# Patient Record
Sex: Female | Born: 1955 | Hispanic: No | Marital: Single | Smoking: Never smoker
Health system: Southern US, Community
[De-identification: ages and names within clinical notes are randomized; demographics above are authoritative.]

## PROBLEM LIST (undated history)

## (undated) DIAGNOSIS — E119 Type 2 diabetes mellitus without complications: Secondary | ICD-10-CM

## (undated) HISTORY — PX: APPENDECTOMY: SHX54

## (undated) HISTORY — DX: Type 2 diabetes mellitus without complications: E11.9

---

## 1997-11-17 ENCOUNTER — Other Ambulatory Visit: Admission: RE | Admit: 1997-11-17 | Discharge: 1997-11-17 | Payer: Self-pay | Admitting: Obstetrics and Gynecology

## 2003-09-09 ENCOUNTER — Emergency Department (HOSPITAL_COMMUNITY): Admission: EM | Admit: 2003-09-09 | Discharge: 2003-09-09 | Payer: Self-pay | Admitting: Emergency Medicine

## 2007-05-13 ENCOUNTER — Ambulatory Visit: Payer: Self-pay | Admitting: Obstetrics & Gynecology

## 2007-05-20 ENCOUNTER — Ambulatory Visit: Payer: Self-pay | Admitting: Obstetrics & Gynecology

## 2007-05-27 ENCOUNTER — Ambulatory Visit: Payer: Self-pay | Admitting: Urology

## 2007-06-17 ENCOUNTER — Ambulatory Visit: Payer: Self-pay | Admitting: Gastroenterology

## 2007-09-06 ENCOUNTER — Inpatient Hospital Stay: Payer: Self-pay | Admitting: Surgery

## 2009-01-14 ENCOUNTER — Emergency Department (HOSPITAL_COMMUNITY): Admission: EM | Admit: 2009-01-14 | Discharge: 2009-01-14 | Payer: Self-pay | Admitting: Emergency Medicine

## 2010-12-21 ENCOUNTER — Ambulatory Visit: Payer: Self-pay | Admitting: Obstetrics & Gynecology

## 2017-04-13 ENCOUNTER — Ambulatory Visit: Payer: Self-pay | Admitting: Obstetrics & Gynecology

## 2017-05-02 ENCOUNTER — Encounter: Payer: Self-pay | Admitting: Obstetrics & Gynecology

## 2017-05-02 ENCOUNTER — Ambulatory Visit (INDEPENDENT_AMBULATORY_CARE_PROVIDER_SITE_OTHER): Payer: POS | Admitting: Obstetrics & Gynecology

## 2017-05-02 VITALS — BP 120/80 | HR 82 | Ht 62.0 in | Wt 150.0 lb

## 2017-05-02 DIAGNOSIS — Z01419 Encounter for gynecological examination (general) (routine) without abnormal findings: Secondary | ICD-10-CM | POA: Diagnosis not present

## 2017-05-02 DIAGNOSIS — Z124 Encounter for screening for malignant neoplasm of cervix: Secondary | ICD-10-CM

## 2017-05-02 DIAGNOSIS — Z1231 Encounter for screening mammogram for malignant neoplasm of breast: Secondary | ICD-10-CM | POA: Diagnosis not present

## 2017-05-02 DIAGNOSIS — Z1211 Encounter for screening for malignant neoplasm of colon: Secondary | ICD-10-CM | POA: Diagnosis not present

## 2017-05-02 DIAGNOSIS — Z1239 Encounter for other screening for malignant neoplasm of breast: Secondary | ICD-10-CM

## 2017-05-02 DIAGNOSIS — Z0000 Encounter for general adult medical examination without abnormal findings: Secondary | ICD-10-CM | POA: Insufficient documentation

## 2017-05-02 NOTE — Patient Instructions (Signed)
PAP every three years Mammogram every year    Call [phone] to schedule at [facility] Colonoscopy every 10 years Labs yearly (with PCP) 

## 2017-05-02 NOTE — Progress Notes (Signed)
HPI:      [name] is a 61 y.o. G1P0010 who LMP was in the past, she presents today for her annual examination.  The patient has no complaints today. The patient is not sexually active. Herlast pap: approximate date 2014 and was normal and last mammogram: approximate date 2016 and was normal.  The patient does perform self breast exams.  There is notable family history of breast or ovarian cancer in her family. The patient is not taking hormone replacement therapy. Patient denies post-menopausal vaginal bleeding.   The patient has regular exercise: yes. The patient denies current symptoms of depression.    GYN Hx: Last Colonoscopy:10 years ago. Normal.  Last DEXA: DEXA ago.    PMHx: History reviewed. No pertinent past medical history. Past Surgical History:  Procedure Laterality Date  . APPENDECTOMY     Family History  Problem Relation Age of Onset  . Diabetes Mother   . Hypertension Mother   . Breast cancer Sister   . Diabetes Sister   . Heart disease Sister    Social History  Substance Use Topics  . Smoking status: Never Smoker  . Smokeless tobacco: Never Used  . Alcohol use Yes   No current outpatient prescriptions on file. Allergies: Patient has no known allergies.  Review of Systems  Constitutional: Negative for chills, fever and malaise/fatigue.  HENT: Negative for congestion, sinus pain and sore throat.   Eyes: Negative for blurred vision and pain.  Respiratory: Negative for cough and wheezing.   Cardiovascular: Negative for chest pain and leg swelling.  Gastrointestinal: Negative for abdominal pain, constipation, diarrhea, heartburn, nausea and vomiting.  Genitourinary: Negative for dysuria, frequency, hematuria and urgency.  Musculoskeletal: Negative for back pain, joint pain, myalgias and neck pain.  Skin: Negative for itching and rash.  Neurological: Negative for dizziness, tremors and weakness.  Endo/Heme/Allergies: Does not bruise/bleed easily.    Psychiatric/Behavioral: Negative for depression. The patient is not nervous/anxious and does not have insomnia.     Objective: BP 120/80   Pulse 82   Ht 5\' 2"  (1.575 m)   Wt 150 lb (68 kg)   BMI 27.44 kg/m   Filed Weights   05/02/17 1028  Weight: 150 lb (68 kg)   Body mass index is 27.44 kg/m. Physical Exam  Constitutional: She is oriented to person, place, and time. She appears well-developed and well-nourished. No distress.  Genitourinary: Rectum normal and vagina normal. Pelvic exam was performed with patient supine. There is no rash or lesion on the right labia. There is no rash or lesion on the left labia. Vagina exhibits no lesion. No bleeding in the vagina. Right adnexum does not display mass and does not display tenderness. Left adnexum does not display mass and does not display tenderness. Cervix does not exhibit motion tenderness, lesion, friability or polyp.   Uterus is enlarged, mobile and anteverted. Uterus is not exhibiting a mass.  Genitourinary Comments: Uterus sl enlarged, no pain  HENT:  Head: Normocephalic and atraumatic. Head is without laceration.  Right Ear: Hearing normal.  Left Ear: Hearing normal.  Nose: No epistaxis.  No foreign bodies.  Mouth/Throat: Uvula is midline, oropharynx is clear and moist and mucous membranes are normal.  Eyes: Pupils are equal, round, and reactive to light.  Neck: Normal range of motion. Neck supple. No thyromegaly present.  Cardiovascular: Normal rate and regular rhythm.  Exam reveals no gallop and no friction rub.   No murmur heard. Pulmonary/Chest: Effort normal and breath  sounds normal. No respiratory distress. She has no wheezes. Right breast exhibits no mass, no skin change and no tenderness. Left breast exhibits no mass, no skin change and no tenderness.  Abdominal: Soft. Bowel sounds are normal. She exhibits no distension. There is no tenderness. There is no rebound.  Musculoskeletal: Normal range of motion.   Neurological: She is alert and oriented to person, place, and time. No cranial nerve deficit.  Skin: Skin is warm and dry.  Psychiatric: She has a normal mood and affect. Judgment normal.  Vitals reviewed.   Assessment: Annual Exam 1. Annual physical exam   2. Screening for cervical cancer   3. Screening for breast cancer   4. Screen for colon cancer     Plan:            1.  Cervical Screening-  Pap smear done today  2. Breast screening- Exam annually and mammogram scheduled  3. Colonoscopy every 10 years, Hemoccult testing after age 67  4. Labs managed by PCP  5. Counseling for hormonal therapy: none, no change in therapy today     F/U  Return in about 1 year (around 05/02/2018) for Annual.  Annamarie Major, MD, [facility], [facility] 05/02/2017  10:52 AM

## 2017-05-05 LAB — IGP, APTIMA HPV
HPV Aptima: NEGATIVE
PAP Smear Comment: 0

## 2017-05-16 ENCOUNTER — Ambulatory Visit
Admission: RE | Admit: 2017-05-16 | Discharge: 2017-05-16 | Disposition: A | Discharge status: Home / Self Care | Payer: POS | Source: Ambulatory Visit | Attending: Obstetrics & Gynecology | Admitting: Obstetrics & Gynecology

## 2017-05-16 DIAGNOSIS — Z1231 Encounter for screening mammogram for malignant neoplasm of breast: Secondary | ICD-10-CM | POA: Diagnosis present

## 2017-05-16 DIAGNOSIS — Z1239 Encounter for other screening for malignant neoplasm of breast: Secondary | ICD-10-CM

## 2017-05-17 ENCOUNTER — Encounter: Payer: Self-pay | Admitting: Obstetrics & Gynecology

## 2018-06-26 ENCOUNTER — Other Ambulatory Visit: Payer: Self-pay | Admitting: Internal Medicine

## 2018-06-26 DIAGNOSIS — Z1231 Encounter for screening mammogram for malignant neoplasm of breast: Secondary | ICD-10-CM

## 2018-07-16 ENCOUNTER — Ambulatory Visit
Admission: RE | Admit: 2018-07-16 | Discharge: 2018-07-16 | Disposition: A | Discharge status: Home / Self Care | Payer: POS | Source: Ambulatory Visit | Attending: Internal Medicine | Admitting: Internal Medicine

## 2018-07-16 DIAGNOSIS — Z1231 Encounter for screening mammogram for malignant neoplasm of breast: Secondary | ICD-10-CM | POA: Insufficient documentation

## 2018-07-18 ENCOUNTER — Ambulatory Visit (INDEPENDENT_AMBULATORY_CARE_PROVIDER_SITE_OTHER): Payer: POS | Admitting: Obstetrics & Gynecology

## 2018-07-18 ENCOUNTER — Encounter: Payer: Self-pay | Admitting: Obstetrics & Gynecology

## 2018-07-18 VITALS — BP 130/82 | Ht 62.0 in | Wt 165.0 lb

## 2018-07-18 DIAGNOSIS — Z01419 Encounter for gynecological examination (general) (routine) without abnormal findings: Secondary | ICD-10-CM | POA: Diagnosis not present

## 2018-07-18 DIAGNOSIS — Z0000 Encounter for general adult medical examination without abnormal findings: Secondary | ICD-10-CM

## 2018-07-18 NOTE — Patient Instructions (Signed)
PAP every three years Mammogram every year    Done! Colonoscopy every 10 years, Done 2019! Labs yearly (with PCP)  Ann JunglingBlack Cohash for hot flashes   Over the counter  Black Cohosh, Cimicifuga racemosa oral dosage forms What is this medicine? BLACK COHOSH (blak KOH hosh) or Cimicifuga racemosa is a dietary supplement. It is promoted to relieve symptoms of menopause, such as hot flashes. The FDA has not approved this supplement for any medical use. This supplement may be used for other purposes; ask your health care provider or pharmacist if you have questions. This medicine may be used for other purposes; ask your health care provider or pharmacist if you have questions. What should I tell my health care provider before I take this medicine? They need to know if you have any of these conditions: -breast cancer -cervical, ovarian or uterine cancer -high blood pressure -infertility -liver disease -menstrual changes or irregular periods -unusual vaginal or uterine bleeding -an unusual or allergic reaction to black cohosh, soybeans, tartrazine dye (yellow dye number 5), other medicines, foods, dyes, or preservatives -pregnant or trying to get pregnant -breast-feeding How should I use this medicine? Take this herb by mouth with a glass of water. Follow the directions on the package labeling, or talk to your health care professional. Do not use for longer than 6 months without the advice of a health care professional. Do not use if you are pregnant or breast-feeding. Talk to your obstetrician-gynecologist or certified nurse-midwife. This herb is not for use in children under the age of 18 years. Overdosage: If you think you have taken too much of this medicine contact a poison control center or emergency room at once. NOTE: This medicine is only for you. Do not share this medicine with others. What if I miss a dose? If you miss a dose, take it as soon as you can. If it is almost time for your  next dose, take only that dose. Do not take double or extra doses. What may interact with this medicine? -atorvastatin -cisplatin -fertility treatments This list may not describe all possible interactions. Give your health care provider a list of all the medicines, herbs, non-prescription drugs, or dietary supplements you use. Also tell them if you smoke, drink alcohol, or use illegal drugs. Some items may interact with your medicine. What should I watch for while using this medicine? Since this herb is derived from a plant, allergic reactions are possible. Stop using this herb if you develop a rash. You may need to see your health care professional, or inform them that this occurred. Report any unusual side effects promptly. If you are taking this herb for menstrual or menopausal symptoms, visit your doctor or health care professional for regular checks on your progress. You should have a complete check-up every 6 months. You will need a regular breast and pelvic exam while on this therapy. Follow the advice of your doctor or health care professional. Women should inform their doctor if they wish to become pregnant or think they might be pregnant. If you have any reason to think you are pregnant, stop taking this herb at once and contact your doctor or health care professional. Herbal or dietary supplements are not regulated like medicines. Rigid quality control standards are not required for dietary supplements. The purity and strength of these products can vary. The safety and effect of this dietary supplement for a certain disease or illness is not well known. This product is not intended to diagnose,  treat, cure or prevent any disease. The Food and Drug Administration suggests the following to help consumers protect themselves: -Always read product labels and follow directions. -Natural does not mean a product is safe for humans to take. -Look for products that include USP after the ingredient  name. This means that the manufacturer followed the standards of the U.S. Pharmacopoeia. -Supplements made or sold by a nationally known food or drug company are more likely to be made under tight controls. You can write to the company for more information about how the product was made. What side effects may I notice from receiving this medicine? Side effects that you should report to your doctor or health care professional as soon as possible: -allergic reactions like skin rash, itching or hives, swelling of the face, lips, or tongue -breathing problems -dizziness -palpitations -signs and symptoms of liver injury like dark yellow or brown urine; general ill feeling or flu-like symptoms; light-colored stools; loss of appetite; nausea; right upper belly pain; unusually weak or tired; yellowing of the eyes or skin -unusual vaginal bleeding Side effects that usually do not require medical attention (report to your doctor or health care professional if they continue or are bothersome): -breast tenderness -headache -nausea -upset stomach This list may not describe all possible side effects. Call your doctor for medical advice about side effects. You may report side effects to FDA at 1-800-FDA-1088. Where should I keep my medicine? Keep out of the reach of children. Store at room temperature between 15 and 30 degrees C (59 and 86 degrees C). Throw away any unused herb after the expiration date. NOTE: This sheet is a summary. It may not cover all possible information. If you have questions about this medicine, talk to your doctor, pharmacist, or health care provider.  2018 Elsevier/Gold Standard (2016-02-23 14:35:09)

## 2018-07-18 NOTE — Progress Notes (Signed)
HPI:      [name] is a 62 y.o. G1P0010 who LMP was in the past, she presents today for her annual examination.  The patient has no complaints today. The patient is not currently sexually active. Herlast pap: approximate date 2018 and was normal and last mammogram: approximate date 06/2018 and was normal.  The patient does perform self breast exams.  There is no notable family history of breast or ovarian cancer in her family. The patient is not taking hormone replacement therapy. Patient denies post-menopausal vaginal bleeding.   The patient has regular exercise: yes. The patient denies current symptoms of depression. Occas hot flashes.   GYN Hx: Last Colonoscopy:5 mos ago. Normal.  Last DEXA: never ago.    PMHx: History reviewed. No pertinent past medical history. Past Surgical History:  Procedure Laterality Date  . APPENDECTOMY     Family History  Problem Relation Age of Onset  . Diabetes Mother   . Hypertension Mother   . Breast cancer Sister   . Diabetes Sister   . Heart disease Sister    Social History   Tobacco Use  . Smoking status: Never Smoker  . Smokeless tobacco: Never Used  Substance Use Topics  . Alcohol use: Yes  . Drug use: No   No current outpatient medications on file. Allergies: Patient has no known allergies.  Review of Systems  Constitutional: Negative for chills, fever and malaise/fatigue.  HENT: Negative for congestion, sinus pain and sore throat.   Eyes: Negative for blurred vision and pain.  Respiratory: Negative for cough and wheezing.   Cardiovascular: Negative for chest pain and leg swelling.  Gastrointestinal: Negative for abdominal pain, constipation, diarrhea, heartburn, nausea and vomiting.  Genitourinary: Negative for dysuria, frequency, hematuria and urgency.  Musculoskeletal: Negative for back pain, joint pain, myalgias and neck pain.  Skin: Negative for itching and rash.  Neurological: Negative for dizziness, tremors and  weakness.  Endo/Heme/Allergies: Does not bruise/bleed easily.  Psychiatric/Behavioral: Negative for depression. The patient is not nervous/anxious and does not have insomnia.     Objective: BP 130/82   Ht 5\' 2"  (1.575 m)   Wt 165 lb (74.8 kg)   BMI 30.18 kg/m   Filed Weights   07/18/18 1335  Weight: 165 lb (74.8 kg)   Body mass index is 30.18 kg/m. Physical Exam  Constitutional: She is oriented to person, place, and time. She appears well-developed and well-nourished. No distress.  Genitourinary: Rectum normal, vagina normal and uterus normal. Pelvic exam was performed with patient supine. There is no rash or lesion on the right labia. There is no rash or lesion on the left labia. Vagina exhibits no lesion. No bleeding in the vagina. Right adnexum does not display mass and does not display tenderness. Left adnexum does not display mass and does not display tenderness. Cervix does not exhibit motion tenderness, lesion, friability or polyp.   Uterus is mobile and midaxial. Uterus is not enlarged or exhibiting a mass.  HENT:  Head: Normocephalic and atraumatic. Head is without laceration.  Right Ear: Hearing normal.  Left Ear: Hearing normal.  Nose: No epistaxis.  No foreign bodies.  Mouth/Throat: Uvula is midline, oropharynx is clear and moist and mucous membranes are normal.  Eyes: Pupils are equal, round, and reactive to light.  Neck: Normal range of motion. Neck supple. No thyromegaly present.  Cardiovascular: Normal rate and regular rhythm. Exam reveals no gallop and no friction rub.  No murmur heard. Pulmonary/Chest: Effort normal and  breath sounds normal. No respiratory distress. She has no wheezes. Right breast exhibits no mass, no skin change and no tenderness. Left breast exhibits no mass, no skin change and no tenderness.  Abdominal: Soft. Bowel sounds are normal. She exhibits no distension. There is no tenderness. There is no rebound.  Musculoskeletal: Normal range of  motion.  Neurological: She is alert and oriented to person, place, and time. No cranial nerve deficit.  Skin: Skin is warm and dry.  Psychiatric: She has a normal mood and affect. Judgment normal.  Vitals reviewed.  Assessment: Annual Exam 1. Annual physical exam    Plan:            1.  Cervical Screening-  Pap smear schedule reviewed with patient  2. Breast screening- Exam annually and mammogram scheduled  3. Colonoscopy every 10 years, Hemoccult testing after age 60  4. Labs managed by PCP  5. Counseling for hormonal therapy: none. Black cohash counseled for hot flash remedy  6. DEXA next year    F/U  Return in about 1 year (around 07/19/2019) for Annual.  Annamarie Major, MD, [facility], [facility] 07/18/2018  2:38 PM

## 2020-03-08 ENCOUNTER — Other Ambulatory Visit: Payer: Self-pay | Admitting: Internal Medicine

## 2020-03-08 DIAGNOSIS — Z1231 Encounter for screening mammogram for malignant neoplasm of breast: Secondary | ICD-10-CM

## 2020-03-17 ENCOUNTER — Ambulatory Visit
Admission: RE | Admit: 2020-03-17 | Discharge: 2020-03-17 | Disposition: A | Discharge status: Home / Self Care | Payer: Medicare Other | Source: Ambulatory Visit | Attending: Internal Medicine | Admitting: Internal Medicine

## 2020-03-17 DIAGNOSIS — Z1231 Encounter for screening mammogram for malignant neoplasm of breast: Secondary | ICD-10-CM

## 2020-04-23 ENCOUNTER — Ambulatory Visit (INDEPENDENT_AMBULATORY_CARE_PROVIDER_SITE_OTHER): Payer: Medicare Other | Admitting: Obstetrics & Gynecology

## 2020-04-23 ENCOUNTER — Encounter: Payer: Self-pay | Admitting: Obstetrics & Gynecology

## 2020-04-23 ENCOUNTER — Other Ambulatory Visit: Payer: Self-pay

## 2020-04-23 ENCOUNTER — Other Ambulatory Visit (HOSPITAL_COMMUNITY)
Admission: RE | Admit: 2020-04-23 | Discharge: 2020-04-23 | Disposition: A | Discharge status: Home / Self Care | Payer: Medicare Other | Source: Ambulatory Visit | Attending: Obstetrics & Gynecology | Admitting: Obstetrics & Gynecology

## 2020-04-23 VITALS — BP 130/80 | Ht 62.0 in | Wt 161.0 lb

## 2020-04-23 DIAGNOSIS — Z1211 Encounter for screening for malignant neoplasm of colon: Secondary | ICD-10-CM | POA: Diagnosis not present

## 2020-04-23 DIAGNOSIS — Z01419 Encounter for gynecological examination (general) (routine) without abnormal findings: Secondary | ICD-10-CM | POA: Insufficient documentation

## 2020-04-23 DIAGNOSIS — Z1151 Encounter for screening for human papillomavirus (HPV): Secondary | ICD-10-CM | POA: Diagnosis not present

## 2020-04-23 DIAGNOSIS — Z780 Asymptomatic menopausal state: Secondary | ICD-10-CM | POA: Diagnosis not present

## 2020-04-23 DIAGNOSIS — Z124 Encounter for screening for malignant neoplasm of cervix: Secondary | ICD-10-CM

## 2020-04-23 NOTE — Patient Instructions (Signed)
PAP every three years Mammogram every year    Call [phone] to schedule at [facility] Colonoscopy every 10 years Labs yearly (with PCP)  Thank you for choosing [facility]. As part of our ongoing efforts to improve patient experience, we would appreciate your feedback. Please fill out the short survey that you will receive by mail or MyChart. Your opinion is important to [place]! - Dr. Tiburcio Pea  For HOT FLASHES:  Black Cohosh, Cimicifuga racemosa oral dosage forms What is this medicine? BLACK COHOSH (blak KOH hosh) or Cimicifuga racemosa is a dietary supplement. It is promoted to relieve symptoms of menopause, such as hot flashes. The FDA has not approved this supplement for any medical use. This supplement may be used for other purposes; ask your health care provider or pharmacist if you have questions. This medicine may be used for other purposes; ask your health care provider or pharmacist if you have questions. What should I tell my health care provider before I take this medicine? They need to know if you have any of these conditions:  breast cancer  cervical, ovarian or uterine cancer  high blood pressure  infertility  liver disease  menstrual changes or irregular periods  unusual vaginal or uterine bleeding  an unusual or allergic reaction to black cohosh, soybeans, tartrazine dye (yellow dye number 5), other medicines, foods, dyes, or preservatives  pregnant or trying to get pregnant  breast-feeding How should I use this medicine? Take this herb by mouth with a glass of water. Follow the directions on the package labeling, or talk to your health care professional. Do not use for longer than 6 months without the advice of a health care professional. Do not use if you are pregnant or breast-feeding. Talk to your obstetrician-gynecologist or certified nurse-midwife. This herb is not for use in children under the age of 18 years. Overdosage: If you think you have taken too  much of this medicine contact a poison control center or emergency room at once. NOTE: This medicine is only for you. Do not share this medicine with others. What if I miss a dose? If you miss a dose, take it as soon as you can. If it is almost time for your next dose, take only that dose. Do not take double or extra doses. What may interact with this medicine?  atorvastatin  cisplatin  fertility treatments This list may not describe all possible interactions. Give your health care provider a list of all the medicines, herbs, non-prescription drugs, or dietary supplements you use. Also tell them if you smoke, drink alcohol, or use illegal drugs. Some items may interact with your medicine. What should I watch for while using this medicine? Since this herb is derived from a plant, allergic reactions are possible. Stop using this herb if you develop a rash. You may need to see your health care professional, or inform them that this occurred. Report any unusual side effects promptly. If you are taking this herb for menstrual or menopausal symptoms, visit your doctor or health care professional for regular checks on your progress. You should have a complete check-up every 6 months. You will need a regular breast and pelvic exam while on this therapy. Follow the advice of your doctor or health care professional. Women should inform their doctor if they wish to become pregnant or think they might be pregnant. If you have any reason to think you are pregnant, stop taking this herb at once and contact your doctor or health care  professional. Herbal or dietary supplements are not regulated like medicines. Rigid quality control standards are not required for dietary supplements. The purity and strength of these products can vary. The safety and effect of this dietary supplement for a certain disease or illness is not well known. This product is not intended to diagnose, treat, cure or prevent any disease. The  Food and Drug Administration suggests the following to help consumers protect themselves:  Always read product labels and follow directions.  Natural does not mean a product is safe for humans to take.  Look for products that include USP after the ingredient name. This means that the manufacturer followed the standards of the U.S. Pharmacopoeia.  Supplements made or sold by a nationally known food or drug company are more likely to be made under tight controls. You can write to the company for more information about how the product was made. What side effects may I notice from receiving this medicine? Side effects that you should report to your doctor or health care professional as soon as possible:  allergic reactions like skin rash, itching or hives, swelling of the face, lips, or tongue  breathing problems  dizziness  palpitations  signs and symptoms of liver injury like dark yellow or brown urine; general ill feeling or flu-like symptoms; light-colored stools; loss of appetite; nausea; right upper belly pain; unusually weak or tired; yellowing of the eyes or skin  unusual vaginal bleeding Side effects that usually do not require medical attention (report to your doctor or health care professional if they continue or are bothersome):  breast tenderness  headache  nausea  upset stomach This list may not describe all possible side effects. Call your doctor for medical advice about side effects. You may report side effects to FDA at 1-800-FDA-1088. Where should I keep my medicine? Keep out of the reach of children. Store at room temperature between 15 and 30 degrees C (59 and 86 degrees C). Throw away any unused herb after the expiration date. NOTE: This sheet is a summary. It may not cover all possible information. If you have questions about this medicine, talk to your doctor, pharmacist, or health care provider.  2020 Elsevier/Gold Standard (2016-02-23 14:35:09)

## 2020-04-23 NOTE — Progress Notes (Signed)
HPI:      [name] is a 64 y.o. G1P0010 who LMP was in the past, she presents today for her annual examination.  The patient has no complaints today. Still having hot flashes and night sweats. The patient is not sexually active. Herlast pap: approximate date 2018 and was normal and last mammogram: approximate date 2021 and was normal.  The patient does perform self breast exams.  There is no notable family history of breast or ovarian cancer in her family. The patient is not taking hormone replacement therapy. Patient denies post-menopausal vaginal bleeding.   The patient has regular exercise: yes. The patient denies current symptoms of depression.    GYN Hx: Last Colonoscopy:2 years ago. Normal.  Last DEXA: never ago.    PMHx: History reviewed. No pertinent past medical history. Past Surgical History:  Procedure Laterality Date  . APPENDECTOMY     Family History  Problem Relation Age of Onset  . Diabetes Mother   . Hypertension Mother   . Breast cancer Sister 42  . Diabetes Sister   . Heart disease Sister    Social History   Tobacco Use  . Smoking status: Never Smoker  . Smokeless tobacco: Never Used  Vaping Use  . Vaping Use: Never used  Substance Use Topics  . Alcohol use: Yes  . Drug use: No   No current outpatient medications on file. Allergies: Patient has no known allergies.  Review of Systems  Constitutional: Negative for chills, fever and malaise/fatigue.  HENT: Negative for congestion, sinus pain and sore throat.   Eyes: Negative for blurred vision and pain.  Respiratory: Negative for cough and wheezing.   Cardiovascular: Negative for chest pain and leg swelling.  Gastrointestinal: Negative for abdominal pain, constipation, diarrhea, heartburn, nausea and vomiting.  Genitourinary: Negative for dysuria, frequency, hematuria and urgency.  Musculoskeletal: Negative for back pain, joint pain, myalgias and neck pain.  Skin: Negative for itching and rash.   Neurological: Negative for dizziness, tremors and weakness.  Endo/Heme/Allergies: Does not bruise/bleed easily.  Psychiatric/Behavioral: Negative for depression. The patient is not nervous/anxious and does not have insomnia.     Objective: BP 130/80   Ht 5\' 2"  (1.575 m)   Wt 161 lb (73 kg)   BMI 29.45 kg/m   Filed Weights   04/23/20 0858  Weight: 161 lb (73 kg)   Body mass index is 29.45 kg/m. Physical Exam Constitutional:      General: She is not in acute distress.    Appearance: She is well-developed.  Genitourinary:     Pelvic exam was performed with patient supine.     Vagina, uterus and rectum normal.     No lesions in the vagina.     No vaginal bleeding.     No cervical motion tenderness, friability, lesion or polyp.     Uterus is mobile.     Uterus is not enlarged.     No uterine mass detected.    Uterus is midaxial.     No right or left adnexal mass present.     Right adnexa not tender.     Left adnexa not tender.  HENT:     Head: Normocephalic and atraumatic. No laceration.     Right Ear: Hearing normal.     Left Ear: Hearing normal.     Mouth/Throat:     Pharynx: Uvula midline.  Eyes:     Pupils: Pupils are equal, round, and reactive to light.  Neck:  Thyroid: No thyromegaly.  Cardiovascular:     Rate and Rhythm: Normal rate and regular rhythm.     Heart sounds: No murmur heard.  No friction rub. No gallop.   Pulmonary:     Effort: Pulmonary effort is normal. No respiratory distress.     Breath sounds: Normal breath sounds. No wheezing.  Chest:     Breasts:        Right: No mass, skin change or tenderness.        Left: No mass, skin change or tenderness.  Abdominal:     General: Bowel sounds are normal. There is no distension.     Palpations: Abdomen is soft.     Tenderness: There is no abdominal tenderness. There is no rebound.  Musculoskeletal:        General: Normal range of motion.     Cervical back: Normal range of motion and neck  supple.  Neurological:     Mental Status: She is alert and oriented to person, place, and time.     Cranial Nerves: No cranial nerve deficit.  Skin:    General: Skin is warm and dry.  Psychiatric:        Judgment: Judgment normal.  Vitals reviewed.     Assessment: Annual Exam 1. Women's annual routine gynecological examination   2. Screening for cervical cancer   3. Menopause   4. Screen for colon cancer     Plan:            1.  Cervical Screening-  Pap smear done today  2. Breast screening- Exam annually and mammogram scheduled  3. Colonoscopy every 10 years, Hemoccult testing after age 71  4. Labs managed by PCP  5. Counseling for hormonal therapy: none              6. FRAX - FRAX score for assessing the 10 year probability for fracture calculated and discussed today.  Based on age and score today, DEXA is not currently scheduled.   7. Hot flashes and night sweats, info on [employer] given.  Also discussed Clonidine for future considerations    F/U  Return in about 1 year (around 04/23/2021) for Annual.  Annamarie Major, MD, [facility], [facility] 04/23/2020  9:51 AM

## 2020-04-26 LAB — CYTOLOGY - PAP
Comment: NEGATIVE
Diagnosis: NEGATIVE
High risk HPV: NEGATIVE

## 2021-02-07 ENCOUNTER — Other Ambulatory Visit: Payer: Self-pay | Admitting: Internal Medicine

## 2021-02-07 DIAGNOSIS — Z1231 Encounter for screening mammogram for malignant neoplasm of breast: Secondary | ICD-10-CM

## 2021-03-23 ENCOUNTER — Other Ambulatory Visit: Payer: Self-pay

## 2021-03-23 ENCOUNTER — Ambulatory Visit
Admission: RE | Admit: 2021-03-23 | Discharge: 2021-03-23 | Disposition: A | Discharge status: Home / Self Care | Payer: Medicare Other | Source: Ambulatory Visit | Attending: Internal Medicine | Admitting: Internal Medicine

## 2021-03-23 DIAGNOSIS — Z1231 Encounter for screening mammogram for malignant neoplasm of breast: Secondary | ICD-10-CM | POA: Insufficient documentation

## 2021-04-25 ENCOUNTER — Ambulatory Visit: Payer: Medicare Other | Admitting: Obstetrics & Gynecology

## 2021-04-25 ENCOUNTER — Other Ambulatory Visit: Payer: Self-pay

## 2021-04-25 ENCOUNTER — Ambulatory Visit (INDEPENDENT_AMBULATORY_CARE_PROVIDER_SITE_OTHER): Payer: Medicare Other | Admitting: Obstetrics & Gynecology

## 2021-04-25 ENCOUNTER — Encounter: Payer: Self-pay | Admitting: Obstetrics & Gynecology

## 2021-04-25 VITALS — BP 120/80 | Ht 61.0 in | Wt 160.0 lb

## 2021-04-25 DIAGNOSIS — Z01419 Encounter for gynecological examination (general) (routine) without abnormal findings: Secondary | ICD-10-CM | POA: Diagnosis not present

## 2021-04-25 DIAGNOSIS — Z1211 Encounter for screening for malignant neoplasm of colon: Secondary | ICD-10-CM | POA: Diagnosis not present

## 2021-04-25 DIAGNOSIS — Z780 Asymptomatic menopausal state: Secondary | ICD-10-CM | POA: Diagnosis not present

## 2021-04-25 NOTE — Patient Instructions (Signed)
PAP every three years Mammogram every year    Call [phone] to schedule at [place] Colonoscopy every 10 years Labs yearly (with PCP)  Thank you for choosing [facility]. As part of our ongoing efforts to improve patient experience, we would appreciate your feedback. Please fill out the short survey that you will receive by mail or MyChart. Your opinion is important to us! - Dr. Arien Morine  Recommendations to boost your immunity to prevent illness such as viral flu and colds, including covid19, are as follows:       - - -  Vitamin K2 and Vitamin D3  - - - Take Vitamin K2 at 200-300 mcg daily (usually 2-3 pills daily of the over the counter formulation). Take Vitamin D3 at 3000-4000 U daily (usually 3-4 pills daily of the over the counter formulation). Studies show that these two at high normal levels in your system are very effective in keeping your immunity so strong and protective that you will be unlikely to contract viral illness such as those listed above.  Dr Dujuan Stankowski  

## 2021-04-25 NOTE — Progress Notes (Signed)
HPI:      Ann Gutierrez is a 65 y.o. G1P0010 who LMP was in the past, she presents today for her annual examination.  The patient has no complaints today. The patient is not currently sexually active. Herlast pap: approximate date 2021 and was normal and last mammogram: approximate date 2022 and was normal.  The patient does perform self breast exams.  There is no notable family history of breast or ovarian cancer in her family. The patient is not taking hormone replacement therapy. Patient denies post-menopausal vaginal bleeding.   The patient has regular exercise: yes. The patient denies current symptoms of depression.    GYN Hx: Last Colonoscopy:3 years ago. Normal.  Last DEXA: 2 years ago.  Normal.  PMHx: Past Medical History:  Diagnosis Date   Diabetes mellitus without complication (HCC)    Past Surgical History:  Procedure Laterality Date   APPENDECTOMY     Family History  Problem Relation Age of Onset   Diabetes Mother    Hypertension Mother    Breast cancer Sister 64   Diabetes Sister    Heart disease Sister    Social History   Tobacco Use   Smoking status: Never   Smokeless tobacco: Never  Vaping Use   Vaping Use: Never used  Substance Use Topics   Alcohol use: Yes   Drug use: No    Current Outpatient Medications:    metFORMIN (GLUCOPHAGE-XR) 500 MG 24 hr tablet, Take by mouth., Disp: , Rfl:    pravastatin (PRAVACHOL) 10 MG tablet, Take 10 mg by mouth at bedtime., Disp: , Rfl:  Allergies: Patient has no known allergies.  Review of Systems  Constitutional:  Negative for chills, fever and malaise/fatigue.  HENT:  Negative for congestion, sinus pain and sore throat.   Eyes:  Negative for blurred vision and pain.  Respiratory:  Negative for cough and wheezing.   Cardiovascular:  Negative for chest pain and leg swelling.  Gastrointestinal:  Negative for abdominal pain, constipation, diarrhea, heartburn, nausea and vomiting.  Genitourinary:  Negative for  dysuria, frequency, hematuria and urgency.  Musculoskeletal:  Negative for back pain, joint pain, myalgias and neck pain.  Skin:  Negative for itching and rash.  Neurological:  Negative for dizziness, tremors and weakness.  Endo/Heme/Allergies:  Does not bruise/bleed easily.  Psychiatric/Behavioral:  Negative for depression. The patient is not nervous/anxious and does not have insomnia.    Objective: BP 120/80   Ht 5\' 1"  (1.549 m)   Wt 160 lb (72.6 kg)   BMI 30.23 kg/m   Filed Weights   04/25/21 0952  Weight: 160 lb (72.6 kg)   Body mass index is 30.23 kg/m. Physical Exam Constitutional:      General: She is not in acute distress.    Appearance: She is well-developed.  Genitourinary:     Bladder, rectum and urethral meatus normal.     No lesions in the vagina.     Right Labia: No rash, tenderness or lesions.    Left Labia: No tenderness, lesions or rash.    No vaginal bleeding.      Right Adnexa: not tender and no mass present.    Left Adnexa: not tender and no mass present.    No cervical motion tenderness, friability, lesion or polyp.     Uterus is not enlarged.     No uterine mass detected.    Pelvic exam was performed with patient in the lithotomy position.  Breasts:    Right:  No mass, skin change or tenderness.     Left: No mass, skin change or tenderness.  HENT:     Head: Normocephalic and atraumatic. No laceration.     Right Ear: Hearing normal.     Left Ear: Hearing normal.     Mouth/Throat:     Pharynx: Uvula midline.  Eyes:     Pupils: Pupils are equal, round, and reactive to light.  Neck:     Thyroid: No thyromegaly.  Cardiovascular:     Rate and Rhythm: Normal rate and regular rhythm.     Heart sounds: No murmur heard.   No friction rub. No gallop.  Pulmonary:     Effort: Pulmonary effort is normal. No respiratory distress.     Breath sounds: Normal breath sounds. No wheezing.  Abdominal:     General: Bowel sounds are normal. There is no distension.      Palpations: Abdomen is soft.     Tenderness: There is no abdominal tenderness. There is no rebound.  Musculoskeletal:        General: Normal range of motion.     Cervical back: Normal range of motion and neck supple.  Neurological:     Mental Status: She is alert and oriented to person, place, and time.     Cranial Nerves: No cranial nerve deficit.  Skin:    General: Skin is warm and dry.  Psychiatric:        Judgment: Judgment normal.  Vitals reviewed.    Assessment: Annual Exam 1. Women's annual routine gynecological examination   2. Menopause   3. Screen for colon cancer     Plan:            1.  Cervical Screening-  Pap smear schedule reviewed with patient, Pap smear to be scheduled  2. Breast screening- Exam annually and mammogram scheduled  3. Colonoscopy every 10 years, Hemoccult testing after age 47  4. Labs managed by PCP  5. Counseling for hormonal therapy: none              6. FRAX - FRAX score for assessing the 10 year probability for fracture calculated and discussed today.  Based on age and score today, DEXA is not scheduled.  Done 2020    F/U  Return in about 1 year (around 04/25/2022) for Annual.  Annamarie Major, MD, [facility] 04/25/2021  9:55 AM

## 2021-06-07 IMAGING — MG DIGITAL SCREENING BILAT W/ TOMO W/ CAD
8 series · 8 of 24 positions shown · non-contrast
Comparison: Previous exam(s).

CLINICAL DATA: Screening.

EXAM:
DIGITAL SCREENING BILATERAL MAMMOGRAM WITH TOMO AND CAD

[R CC synth-2D]
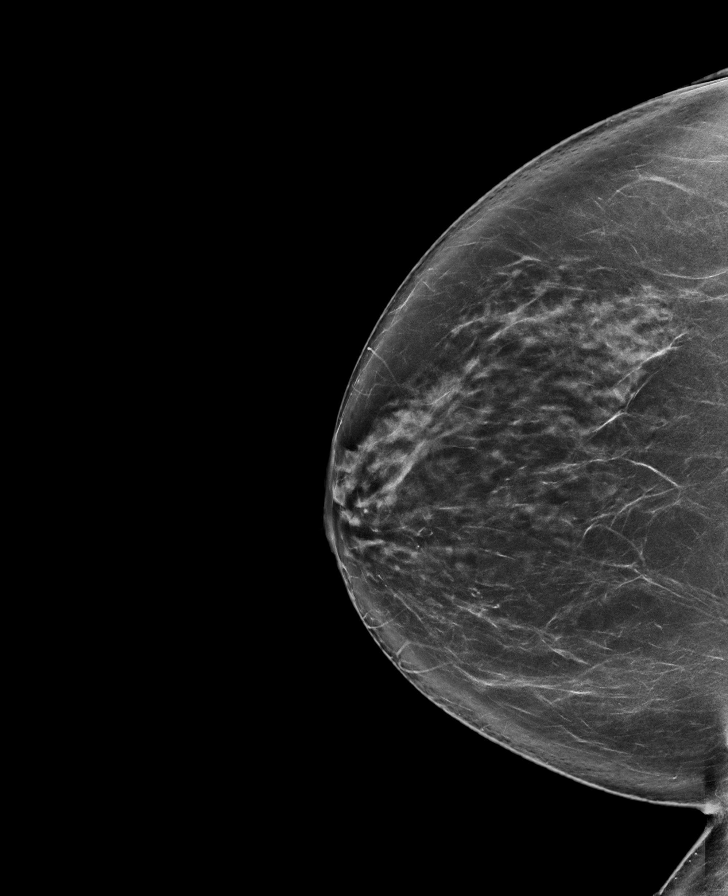

[R MLO synth-2D]
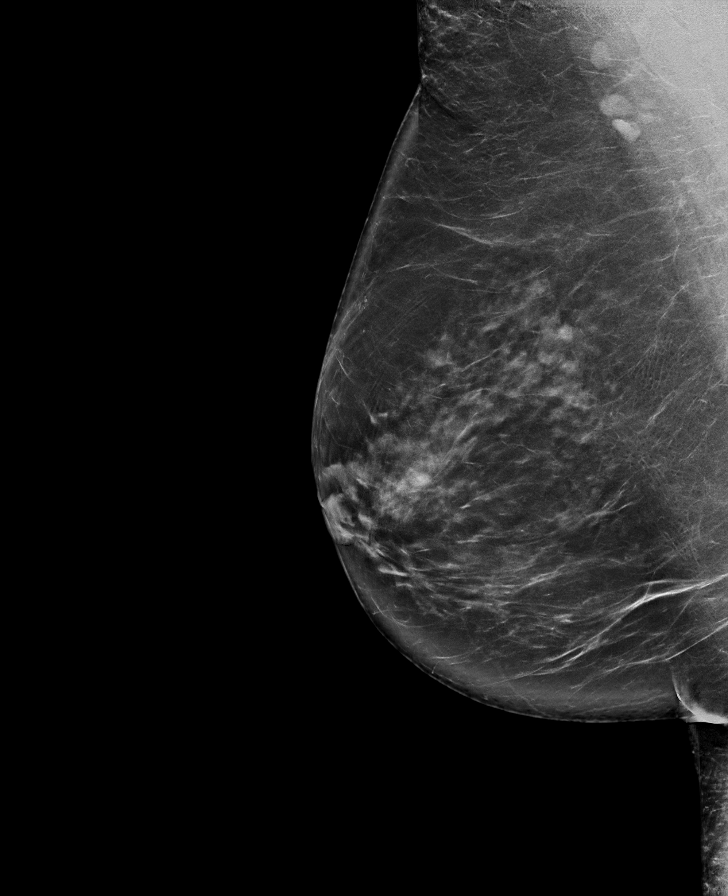

[L CC synth-2D]
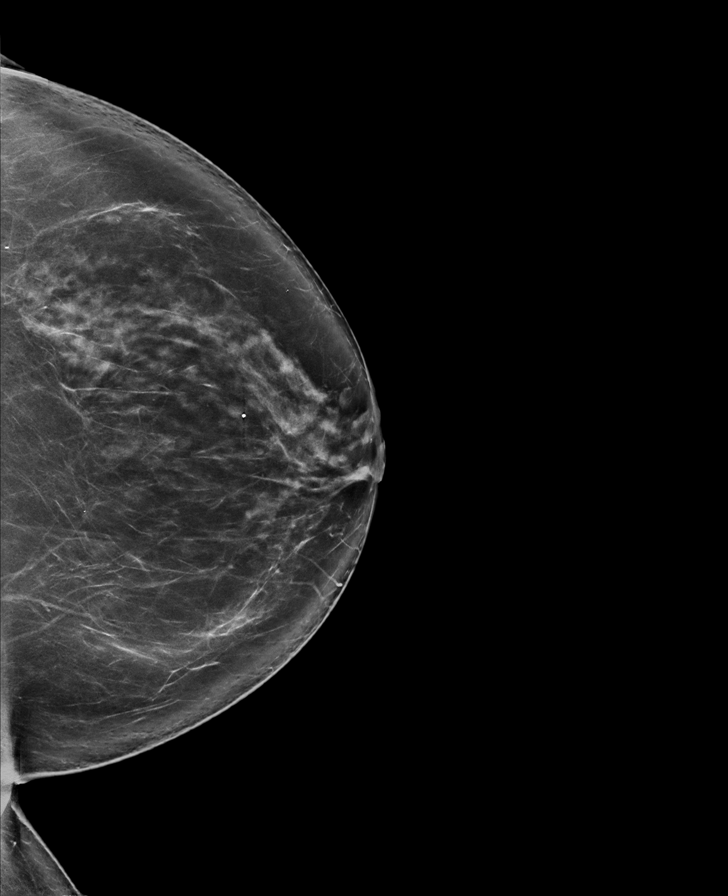

[L MLO synth-2D]
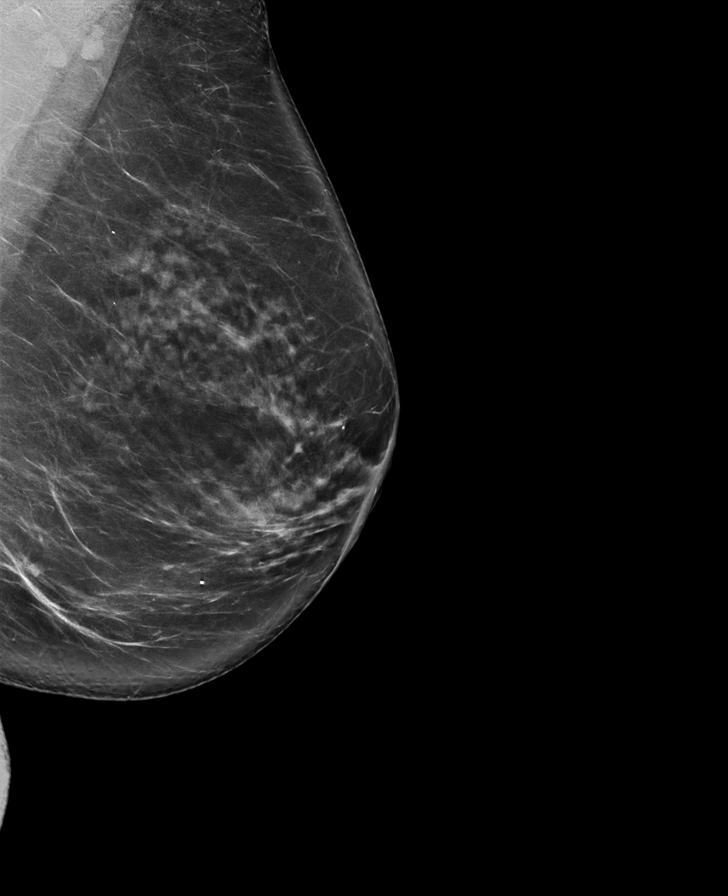

[L CC tomo · tomo slice 47/92.0]
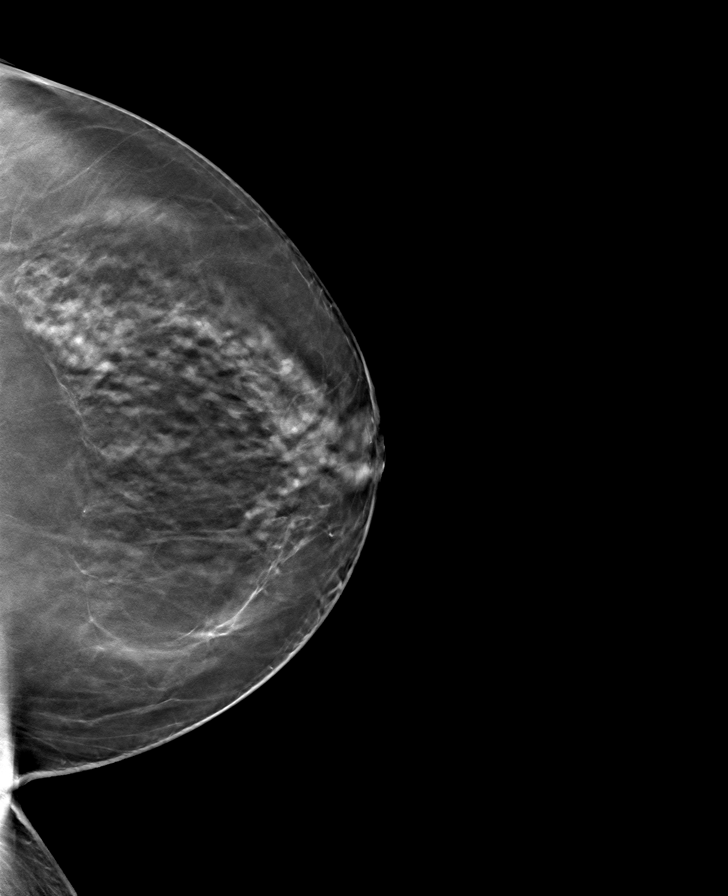

[R MLO tomo · tomo slice 50/99.0]
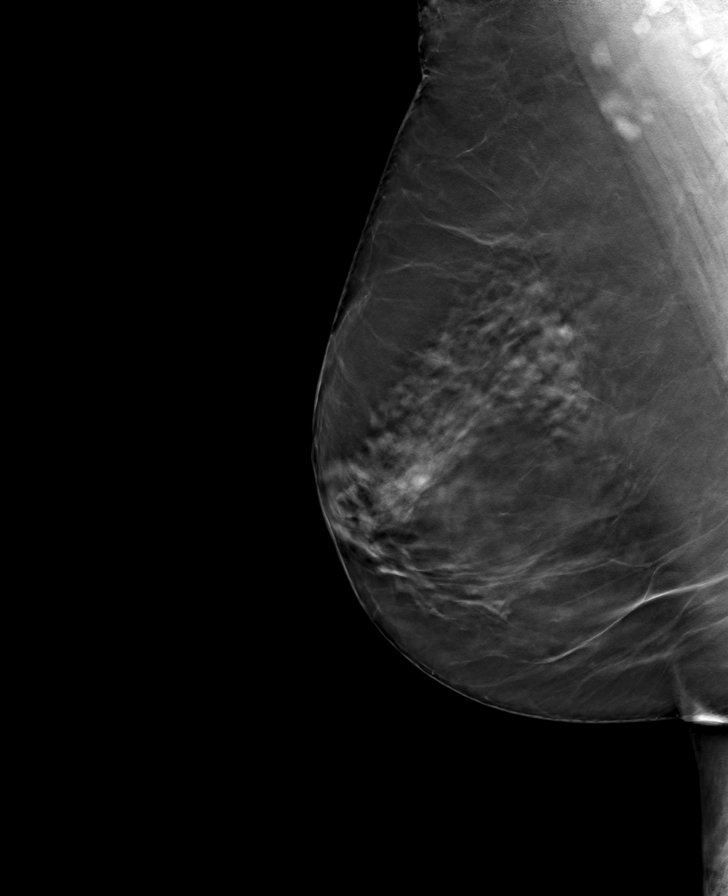

[R CC tomo · tomo slice 47/93.0]
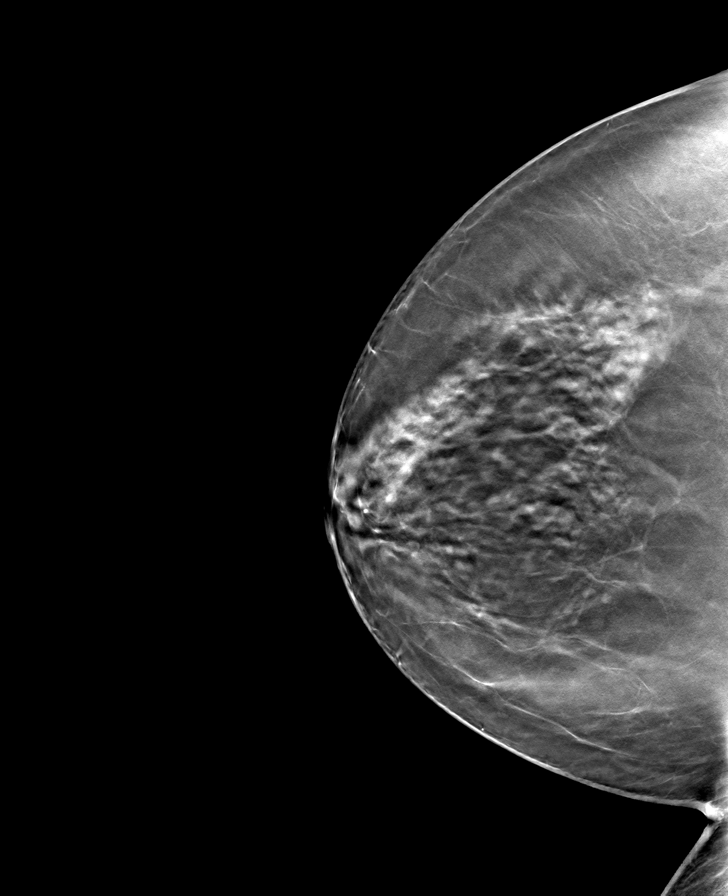

[L MLO tomo · tomo slice 46/91.0]
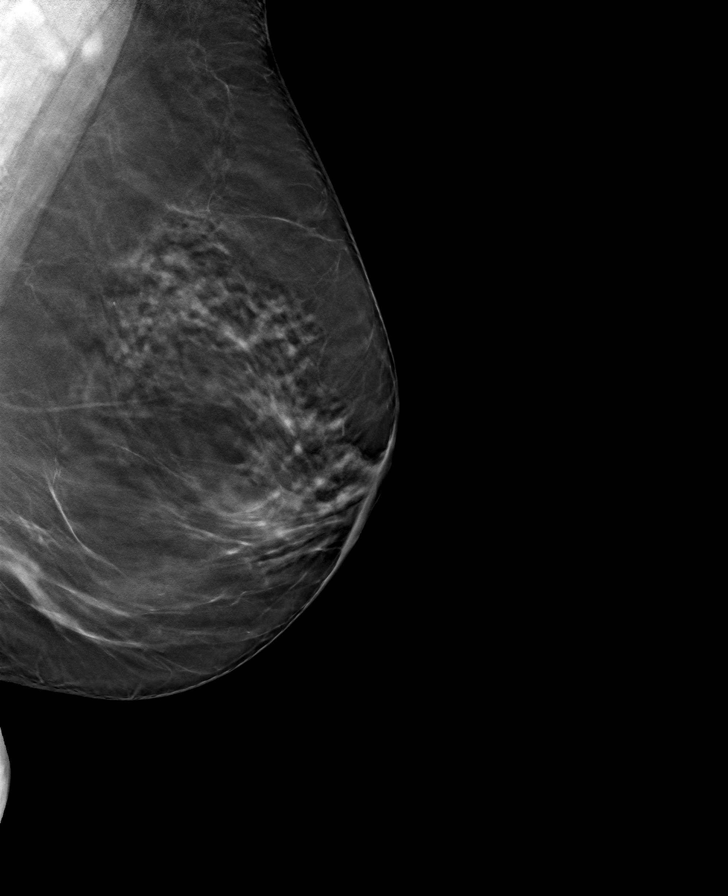

[8 of 24 positions shown; findings below may reference images not displayed]

ACR Breast Density Category c: The breast tissue is heterogeneously
dense, which may obscure small masses.
FINDINGS: There are no findings suspicious for malignancy. Images were
processed with CAD.
IMPRESSION: No mammographic evidence of malignancy. A result letter of this
screening mammogram will be mailed directly to the patient.

RECOMMENDATION:
Screening mammogram in one year. (Code:FT-U-LHB)

BI-RADS CATEGORY  1: Negative.

## 2022-02-20 ENCOUNTER — Other Ambulatory Visit: Payer: Self-pay | Admitting: Internal Medicine

## 2022-02-20 DIAGNOSIS — Z1231 Encounter for screening mammogram for malignant neoplasm of breast: Secondary | ICD-10-CM

## 2022-03-28 ENCOUNTER — Ambulatory Visit
Admission: RE | Admit: 2022-03-28 | Discharge: 2022-03-28 | Disposition: A | Discharge status: Home / Self Care | Payer: Medicare Other | Source: Ambulatory Visit | Attending: Internal Medicine | Admitting: Internal Medicine

## 2022-03-28 DIAGNOSIS — Z1231 Encounter for screening mammogram for malignant neoplasm of breast: Secondary | ICD-10-CM | POA: Diagnosis not present

## 2022-04-04 ENCOUNTER — Other Ambulatory Visit: Payer: Self-pay | Admitting: Internal Medicine

## 2022-04-04 DIAGNOSIS — N6489 Other specified disorders of breast: Secondary | ICD-10-CM

## 2022-04-04 DIAGNOSIS — R928 Other abnormal and inconclusive findings on diagnostic imaging of breast: Secondary | ICD-10-CM

## 2022-04-21 ENCOUNTER — Ambulatory Visit
Admission: RE | Admit: 2022-04-21 | Discharge: 2022-04-21 | Disposition: A | Discharge status: Home / Self Care | Payer: Medicare Other | Source: Ambulatory Visit | Attending: Internal Medicine | Admitting: Internal Medicine

## 2022-04-21 DIAGNOSIS — R928 Other abnormal and inconclusive findings on diagnostic imaging of breast: Secondary | ICD-10-CM | POA: Diagnosis present

## 2022-04-21 DIAGNOSIS — N6489 Other specified disorders of breast: Secondary | ICD-10-CM

## 2023-03-23 ENCOUNTER — Other Ambulatory Visit: Payer: Self-pay | Admitting: Internal Medicine

## 2023-03-23 DIAGNOSIS — Z1231 Encounter for screening mammogram for malignant neoplasm of breast: Secondary | ICD-10-CM

## 2023-04-11 ENCOUNTER — Ambulatory Visit
Admission: RE | Admit: 2023-04-11 | Discharge: 2023-04-11 | Disposition: A | Discharge status: Home / Self Care | Payer: Medicare Other | Source: Ambulatory Visit | Attending: Internal Medicine | Admitting: Internal Medicine

## 2023-04-11 DIAGNOSIS — Z1231 Encounter for screening mammogram for malignant neoplasm of breast: Secondary | ICD-10-CM | POA: Insufficient documentation

## 2023-04-17 ENCOUNTER — Other Ambulatory Visit: Payer: Self-pay | Admitting: Obstetrics and Gynecology

## 2023-04-17 DIAGNOSIS — Z1231 Encounter for screening mammogram for malignant neoplasm of breast: Secondary | ICD-10-CM

## 2024-03-06 ENCOUNTER — Other Ambulatory Visit: Payer: Self-pay | Admitting: Internal Medicine

## 2024-03-06 DIAGNOSIS — Z1231 Encounter for screening mammogram for malignant neoplasm of breast: Secondary | ICD-10-CM

## 2024-04-11 ENCOUNTER — Ambulatory Visit
Admission: RE | Admit: 2024-04-11 | Discharge: 2024-04-11 | Disposition: A | Discharge status: Home / Self Care | Source: Ambulatory Visit | Attending: Internal Medicine | Admitting: Internal Medicine

## 2024-04-11 DIAGNOSIS — Z1231 Encounter for screening mammogram for malignant neoplasm of breast: Secondary | ICD-10-CM | POA: Diagnosis present
